# Patient Record
Sex: Male | Born: 1950 | Race: White | Hispanic: No | Marital: Single
Health system: Southern US, Community
[De-identification: ages and names within clinical notes are randomized; demographics above are authoritative.]

## PROBLEM LIST (undated history)

## (undated) ENCOUNTER — Emergency Department (HOSPITAL_BASED_OUTPATIENT_CLINIC_OR_DEPARTMENT_OTHER): Admission: EM | Payer: BC Managed Care – PPO | Source: Home / Self Care

---

## 2003-09-11 ENCOUNTER — Emergency Department (HOSPITAL_COMMUNITY): Admission: EM | Admit: 2003-09-11 | Discharge: 2003-09-11 | Payer: Self-pay | Admitting: *Deleted

## 2003-09-17 ENCOUNTER — Ambulatory Visit (HOSPITAL_COMMUNITY): Admission: RE | Admit: 2003-09-17 | Discharge: 2003-09-17 | Payer: Self-pay | Admitting: *Deleted

## 2003-11-09 ENCOUNTER — Ambulatory Visit (HOSPITAL_COMMUNITY): Admission: RE | Admit: 2003-11-09 | Discharge: 2003-11-09 | Payer: Self-pay | Admitting: Gastroenterology
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
## 2005-11-23 ENCOUNTER — Ambulatory Visit (HOSPITAL_BASED_OUTPATIENT_CLINIC_OR_DEPARTMENT_OTHER): Admission: RE | Admit: 2005-11-23 | Discharge: 2005-11-24 | Payer: Self-pay | Admitting: Orthopedic Surgery

## 2005-12-19 ENCOUNTER — Ambulatory Visit (HOSPITAL_BASED_OUTPATIENT_CLINIC_OR_DEPARTMENT_OTHER): Admission: RE | Admit: 2005-12-19 | Discharge: 2005-12-19 | Payer: Self-pay | Admitting: Orthopedic Surgery

## 2005-12-19 ENCOUNTER — Encounter (INDEPENDENT_AMBULATORY_CARE_PROVIDER_SITE_OTHER): Payer: Self-pay | Admitting: Specialist

## 2007-11-01 ENCOUNTER — Ambulatory Visit (HOSPITAL_BASED_OUTPATIENT_CLINIC_OR_DEPARTMENT_OTHER): Admission: RE | Admit: 2007-11-01 | Discharge: 2007-11-01 | Payer: Self-pay | Admitting: Orthopedic Surgery

## 2007-11-01 ENCOUNTER — Encounter (INDEPENDENT_AMBULATORY_CARE_PROVIDER_SITE_OTHER): Payer: Self-pay | Admitting: Orthopedic Surgery

## 2010-11-18 NOTE — Op Note (Signed)
NAME:  Franklin Hale, Franklin Hale                        ACCOUNT NO.:  [id number]   MEDICAL RECORD NO.:  [id number]                   PATIENT TYPE:  AMB   LOCATION:  ENDO                                 FACILITY:  [facility]   PHYSICIAN:  Petra Kuba, M.D.                 DATE OF BIRTH:  05-Dec-1950   DATE OF PROCEDURE:  11/09/2003  DATE OF DISCHARGE:                                 OPERATIVE REPORT   PROCEDURE:  Colonoscopy.   INDICATIONS:  Screening.  Consent was signed after risks, benefits, methods  and options were thoroughly discussed prior to any premedications given, and  with my nurse in the office.   MEDICATIONS USED:  1. Demerol 70 mg .  2. Versed  8 mg.   DESCRIPTION OF PROCEDURE:  Rectal inspection is pertinent for small external  hemorrhoids.  Digital examination was negative.  Video pediatric adjustable  colonoscope was inserted, fairly easily advanced around the colon to the  cecum.  This did require rolling him onto his back and some abdominal  pressures.  No abnormality was seen on insertion.  The cecum was identified  by the appendiceal orifice and the ileocecal valve.  The scope was inserted  a short way in the terminal ileum, which was normal.  Photo documentation  was obtained.  The scope was slowly withdrawn.   The prep was fairly adequate, after about one liter of washing adequate  visualization was obtained.  On slow withdrawal through the colon, other  than a rare left-sided diverticula, no polyps, tumors, masses were seen.  Anal and rectal pull through in retroflexion did confirm some small  hemorrhoids.   The scope was reinserted a short way up the left side of the colon.  Air was  suctioned and scope removed.  The patient tolerated the procedure well,  there were no obvious immediate complication.   ENDOSCOPIC DIAGNOSES:  1. Tiny internal/external hemorrhoids.  2. Rare left-sided diverticula.  3. Otherwise within normal limits to the cecum and terminal  ileum.   PLAN:  Yearly rectals and guaiacs.  Happy to see patient back p.r.n.,  otherwise repeat screening in 5-10 years, based on screening  recommendations.                                               Petra Kuba, M.D.    MEM/MEDQ  D:  11/09/2003  T:  11/09/2003  Job:  [id number]   cc:   Demetria Pore. Coral Spikes, M.D.  [address]  [address]  [place]  [place] [address]  Fax: [phone]

## 2010-11-18 NOTE — Op Note (Signed)
NAME:  Franklin Hale, Franklin Hale NO.:  [id number]   MEDICAL RECORD NO.:  [id number]          PATIENT TYPE:  AMB   LOCATION:  DSC                          FACILITY:  [facility]   PHYSICIAN:  Katy Fitch. Sypher, M.D. 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  Also, rule out labral degenerative  pathology.   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   OPERATION:  1.  Examination of left shoulder under anesthesia.  2.  Arthroscopic evaluation of left glenohumeral joint with debridement of      degenerative labral fragments and fragments of degenerative      supraspinatus rotator cuff tendon.  3.  Arthroscopic subacromial decompression with bursectomy, coracoacromial      ligament relaxation and acromioplasty.  4.  Open resection of distal clavicle.   OPERATIONS:  Katy Fitch. Sypher, M.D.   ASSISTANT:  Molly Maduro Dasnoit PA-C.   ANESTHESIA:  General endotracheal supplemented by interscalene block.   SUPERVISING ANESTHESIOLOGIST:  Dr. Gypsy Balsam.   INDICATIONS:  [name]  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   He noted soreness in his shoulder after play and after aggressive serving in  overhead shots he would at times feel pain beneath the deltoid muscle.  He  also noted some prominence of the [facility] joint.   At his initial presentation it was apparent from plain x-rays and clinical  examination that he had AC arthropathy and signs of subacromial impingement.   We started a rehabilitation program and advised a period of rest for the  shoulder.   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  Therefore  MRI the shoulder was obtained.   He was noted to have significant degenerative changes at the [facility] joint and  periarticular edema in the distal clavicle and medial acromion.  The contour  of the [facility] joint and capsule was unfavorable.  There was also noted to be a  type 2 acromion morphology and signs of tendinopathy of the rotator cuff.  The anterior labrum appeared to be indistinct.   After lengthy informed consent, we advised Dr. [name] to consider  arthroscopic evaluation of the shoulder anticipating debridement of his  labrum and cuff, careful inspection the rotator cuff  to determine whether  not he is developing a clinically significant rotator cuff degenerative tear  and an anticipated subacromial decompression.  We anticipated performing  distal clavicle resection and I advised him that it was my first choice to  proceed with open resection of the distal clavicle so as to allow repair of  the deltoid and trapezius muscles intraoperatively.   After informed consent by Dr. Gypsy Balsam, an interscalene block was placed in the  holding area and Dr. [name] is brought to the operating room at this time  anticipating the aforementioned procedure.   PROCEDURE:  [name]  [name], MD, is brought to the operating room and  placed in supine position upon the operating table.   Following an interscalene block by Dr.  Gypsy Balsam in the holding area, anesthesia  of the left arm and forequarter was complete.   Dr. [name] was placed in supine position upon the operating table and  general endotracheal anesthesia induced under direct supervision of Dr.  Gypsy Balsam.   Dr. [name] was carefully positioned in the beach-chair position with the  aid of a torso and head-holder designed for shoulder arthroscopy.  Passive  calf compression pumps were applied for deep vein thrombosis prophylaxis.   The entire left shoulder, forequarter and brachium were prepped with  DuraPrep and after waiting 3 minutes to allow the DuraPrep to dry,  impervious arthroscopy drapes were applied.   Examination of the left shoulder under anesthesia revealed elevation 180  degrees, external rotation 95 degrees, internal rotation to 75 degrees  without signs of glenohumeral instability.   The shoulder was then distended with 20 mL of sterile saline with a spinal  needle brought in anteriorly.  The scope was placed through a standard  viewing portal posteriorly and diagnostic arthroscopy revealed intact  hyaline articular cartilage surfaces of the glenoid and humeral head except  for small 5 x 6 mm area of full-thickness cartilage loss of the anterior  humeral head deep to the supraspinatus and rotator interval.  The anterior  labrum was quite degenerative and had a bucket-handle type tear that was  fragmented.  This was documented with a digital camera.  The deep surface of  the supraspinatus likewise had extensive degeneration with fragments of the  tendon hanging within the joint.  I would estimate that the thickness of the  rotator cuff tear was approximately 30% the thickness of the supraspinatus  at its insertion.  The biceps tendon was normal and stable at its origin and  unimpeded through the rotator interval.  There was minor synovitis adjacent to the subscapularis tendon but there was no evidence of a subscapularis  tendon injury.   The infraspinatus and teres minor were intact.   A anterior portal was created under direct vision with a blunt trocar  followed by placement of a 4.5 mm suction shaver.  The fragments of the  labrum were debrided to a smooth margin followed by debridement of the  rotator cuff to a smooth margin.   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   The supraspinatus was debrided through a stable margin and photographic  documentation of the fibrillation was noted.   Unfortunately, due to a light reflex off of the tendon, our digital images  do not well document the degree of fibular degeneration, but I would  estimate that more  than half the thickness of the tendon was loosening its  ground substance.   After completion of the rotator cuff debridement, the joint was thoroughly  lavaged with sterile saline and the arthroscopic equipment removed.  The  scope was placed in subacromial space and a very florid bursitis noted.   After extensive bursectomy, the anatomy the coracoacromial arch was studied.   The [facility] joint was quite prominent with a lip of both acromion and clavicle  causing impingement anteriorly.  The capsule of the [facility] joint was intact but  quite prominent.  The capsule was taken down with the cutting cautery and  the distal clavicle inspected.  There were extensive degenerative changes  and remodeling of the hyaline articular cartilage surface.   The coracoacromial ligament was relaxed with a cutting cautery followed by  use of a suction bur to level the acromion to a type 1 morphology.  The  anterior lip of the acromion was beveled.  The medial osteophyte at the [facility]  joint was removed with the suction bur followed by use of a suction shaver  to remove all capsular debris.  The  cutting cautery was used to release the  [facility] capsule posteriorly, inferiorly and anteriorly.  Hemostasis was achieved  with a bipolar cautery.   The arthroscopic equipment was then removed followed by a 2 cm incision  directly over the distal clavicle.   A 15 blade and small osteotomes were used to elevate the capsule of the [facility]  joint subperiosteally exposing the distal 15 mm of clavicle.   An oscillating saw was used to resect the distal clavicle followed by use of  a rongeur to debride remnants of the meniscus and the capsular tissues that  could hang inferiorly.   After hemostasis was achieved, I digitally palpated the acromioplasty and  found it to be quite smooth and appropriate in morphology.   The dead space created by distal clavicle resection was then repaired by  three figure-of-eight sutures of #2 FiberWire with knots inverted and buried  deep to the muscle fascia.  The wound was then repaired with subdermal sutures of 2-0 Vicryl and  intradermal 3-0 Prolene with Steri-Strips.   The scope was replaced in the subacromial space and all clot from the distal  clavicle resection aspirated with the suction shaver.  A final debridement  of the anterior margin of the acromion was accomplished and photographic  documentation of the decompression also completed.   The arthroscopic equipment was removed followed by closure of the portals  with mattress suture of 3-0 Prolene.   The wounds were dressed with Xeroflo, sterile gauze and Hypafix.  There were  no apparent complications.   Dr. [name] awakened from general anesthesia and transferred to the  recovery room with stable vital signs.   We will probably keep Dr. [name] in the recovery care for observation for  23 hours postoperatively so as to provide IV antibiotic prophylaxis and  appropriate analgesia.  There were no apparent complications.      Katy Fitch Sypher, M.D.  Electronically Signed     RVS/MEDQ  D:   11/23/2005  T:  11/23/2005  Job:  [id number]   cc:   Demetria Pore. Coral Spikes, M.D.  Fax: [phone]

## 2010-11-18 NOTE — Op Note (Signed)
NAME:  Franklin Hale, Franklin Hale NO.:  [id number]   MEDICAL RECORD NO.:  [id number]          PATIENT TYPE:  AMB   LOCATION:  DSC                          FACILITY:  [facility]   PHYSICIAN:  Katy Fitch. Sypher, M.D. DATE OF BIRTH:  06-May-1951   DATE OF PROCEDURE:  12/19/2005  DATE OF DISCHARGE:                                 OPERATIVE REPORT   PREOPERATIVE DIAGNOSIS:  Enlarging mass dorsal aspect right forearm  consistent with probable subcutaneous lipoma.   POSTOPERATIVE DIAGNOSIS:  Probable subcutaneous lipoma measuring 9 x 10 mm.   OPERATION:  Excisional biopsy of mass dorsal aspect right forearm.   OPERATIONS:  Katy Fitch. Sypher, M.D.   ASSISTANT:  Marveen Reeks. Dasnoit, P.A.-C.   ANESTHESIA:  2% lidocaine field block of dorsal right forearm.   ANESTHETIST:  Dr. Teressa Senter, this was performed in the minor operating room at  the [facility].   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  We elected to initially  address a left shoulder predicament with arthroscopy and distal clavicle  resection.  Dr. [name] is now recovering from his left shoulder procedure  and requested that we excise a mass on the dorsal aspect of his right  forearm that he noted approximately six months ago that has been gradually  enlarging in size.  The indication for surgery is diagnosis and hopeful  resolution of this predicament.  After informed consent, he is brought to  the minor operating room at this time anticipating an excisional biopsy  under straight local anesthesia.   PROCEDURE:  Sylvester Harder, M.D., is brought to the operating room and  placed in the supine position on the operating table.  Following side  identification per protocol, the arm was prepped with Betadine and the field  surrounding the mass infiltrated with 2%  lidocaine down to the level of the  extensor musculature and fascia.  The arm was then prepped with Betadine  soap solution and sterilely draped with sterile towels.  Dr. [name]  exsanguinated his hand by fist compression and we directly compressed his  forearm.  An arterial tourniquet on the proximal brachium was inflated to  220 mmHg.   After testing for adequate anesthesia, an 18 mm incision was fashioned  directly over the mass.  The subcutaneous tissues were carefully divided  taking care to identify the lipoma and distinguishing it from the normal  subcutaneous fat by a more profused reddish yellow appearance than the  surrounding subcutaneous fat.  The mass was circumferentially dissected and  found to have a small feeding vessel.  This was transected approximately 5  mm from the mass.  A rongeur was used to contour the surrounding fat to  prevent a dimple.  The wound was then checked for bleeding points and  repaired with intradermal 3-0 Prolene and Steri-Strips.  There were no  apparent complications.  The wound was dressed with sterile gauze and a  Tegaderm transparent dressing.  Dr. [name] will change the Tegaderm dressing after 48 hours.  He will  return to see me in the office for follow-up in one week.  We will contact  him with the results of his biopsy at the time of his office visit or sooner  p.r.n. any surprises.      Katy Fitch Sypher, M.D.  Electronically Signed     RVS/MEDQ  D:  12/19/2005  T:  12/19/2005  Job:  [id number]

## 2012-02-22 ENCOUNTER — Other Ambulatory Visit: Payer: Self-pay | Admitting: Geriatric Medicine

## 2012-02-22 DIAGNOSIS — Z0000 Encounter for general adult medical examination without abnormal findings: Secondary | ICD-10-CM

## 2012-03-05 ENCOUNTER — Ambulatory Visit
Admission: RE | Admit: 2012-03-05 | Discharge: 2012-03-05 | Disposition: A | Discharge status: Home / Self Care | Payer: BC Managed Care – PPO | Source: Ambulatory Visit | Attending: Geriatric Medicine | Admitting: Geriatric Medicine

## 2012-03-05 DIAGNOSIS — Z0000 Encounter for general adult medical examination without abnormal findings: Secondary | ICD-10-CM

## 2012-10-31 ENCOUNTER — Other Ambulatory Visit: Payer: Self-pay | Admitting: Gastroenterology

## 2013-03-17 ENCOUNTER — Other Ambulatory Visit: Payer: Self-pay | Admitting: Geriatric Medicine

## 2013-03-17 DIAGNOSIS — E041 Nontoxic single thyroid nodule: Secondary | ICD-10-CM

## 2013-04-16 ENCOUNTER — Ambulatory Visit
Admission: RE | Admit: 2013-04-16 | Discharge: 2013-04-16 | Disposition: A | Discharge status: Home / Self Care | Payer: BC Managed Care – PPO | Source: Ambulatory Visit | Attending: Geriatric Medicine | Admitting: Geriatric Medicine

## 2013-04-16 DIAGNOSIS — E041 Nontoxic single thyroid nodule: Secondary | ICD-10-CM

## 2014-03-18 ENCOUNTER — Other Ambulatory Visit: Payer: Self-pay | Admitting: Geriatric Medicine

## 2014-03-18 DIAGNOSIS — E041 Nontoxic single thyroid nodule: Secondary | ICD-10-CM

## 2014-03-30 ENCOUNTER — Other Ambulatory Visit: Payer: BC Managed Care – PPO

## 2014-04-15 ENCOUNTER — Ambulatory Visit
Admission: RE | Admit: 2014-04-15 | Discharge: 2014-04-15 | Disposition: A | Discharge status: Home / Self Care | Payer: BC Managed Care – PPO | Source: Ambulatory Visit | Attending: Geriatric Medicine | Admitting: Geriatric Medicine

## 2014-04-15 DIAGNOSIS — E041 Nontoxic single thyroid nodule: Secondary | ICD-10-CM

## 2015-04-02 ENCOUNTER — Other Ambulatory Visit: Payer: Self-pay | Admitting: Geriatric Medicine

## 2015-04-02 DIAGNOSIS — E041 Nontoxic single thyroid nodule: Secondary | ICD-10-CM

## 2015-04-12 ENCOUNTER — Other Ambulatory Visit: Payer: Self-pay

## 2015-04-13 ENCOUNTER — Ambulatory Visit
Admission: RE | Admit: 2015-04-13 | Discharge: 2015-04-13 | Disposition: A | Discharge status: Home / Self Care | Payer: BLUE CROSS/BLUE SHIELD | Source: Ambulatory Visit | Attending: Geriatric Medicine | Admitting: Geriatric Medicine

## 2015-04-13 DIAGNOSIS — E041 Nontoxic single thyroid nodule: Secondary | ICD-10-CM

## 2016-05-18 ENCOUNTER — Other Ambulatory Visit: Payer: Self-pay | Admitting: Geriatric Medicine

## 2016-05-18 DIAGNOSIS — E041 Nontoxic single thyroid nodule: Secondary | ICD-10-CM

## 2016-06-12 ENCOUNTER — Other Ambulatory Visit: Payer: BLUE CROSS/BLUE SHIELD

## 2016-06-12 ENCOUNTER — Ambulatory Visit
Admission: RE | Admit: 2016-06-12 | Discharge: 2016-06-12 | Disposition: A | Discharge status: Home / Self Care | Payer: BLUE CROSS/BLUE SHIELD | Source: Ambulatory Visit | Attending: Geriatric Medicine | Admitting: Geriatric Medicine

## 2016-06-12 DIAGNOSIS — E041 Nontoxic single thyroid nodule: Secondary | ICD-10-CM

## 2018-02-25 ENCOUNTER — Other Ambulatory Visit: Payer: Self-pay | Admitting: Geriatric Medicine

## 2018-02-25 DIAGNOSIS — E041 Nontoxic single thyroid nodule: Secondary | ICD-10-CM

## 2018-04-19 ENCOUNTER — Ambulatory Visit
Admission: RE | Admit: 2018-04-19 | Discharge: 2018-04-19 | Disposition: A | Discharge status: Home / Self Care | Payer: PRIVATE HEALTH INSURANCE | Source: Ambulatory Visit | Attending: Geriatric Medicine | Admitting: Geriatric Medicine

## 2018-04-19 DIAGNOSIS — E041 Nontoxic single thyroid nodule: Secondary | ICD-10-CM

## 2020-04-01 ENCOUNTER — Other Ambulatory Visit: Payer: Self-pay | Admitting: Geriatric Medicine

## 2020-04-01 DIAGNOSIS — E041 Nontoxic single thyroid nodule: Secondary | ICD-10-CM

## 2020-04-14 ENCOUNTER — Ambulatory Visit
Admission: RE | Admit: 2020-04-14 | Discharge: 2020-04-14 | Disposition: A | Discharge status: Home / Self Care | Payer: PRIVATE HEALTH INSURANCE | Source: Ambulatory Visit | Attending: Geriatric Medicine | Admitting: Geriatric Medicine

## 2020-04-14 DIAGNOSIS — E041 Nontoxic single thyroid nodule: Secondary | ICD-10-CM

## 2021-06-06 ENCOUNTER — Other Ambulatory Visit: Payer: Self-pay | Admitting: Geriatric Medicine

## 2021-06-06 DIAGNOSIS — E041 Nontoxic single thyroid nodule: Secondary | ICD-10-CM

## 2021-06-30 ENCOUNTER — Other Ambulatory Visit: Payer: PRIVATE HEALTH INSURANCE

## 2021-08-30 ENCOUNTER — Ambulatory Visit
Admission: RE | Admit: 2021-08-30 | Discharge: 2021-08-30 | Disposition: A | Discharge status: Home / Self Care | Payer: No Typology Code available for payment source | Source: Ambulatory Visit | Attending: Geriatric Medicine | Admitting: Geriatric Medicine

## 2021-08-30 DIAGNOSIS — E041 Nontoxic single thyroid nodule: Secondary | ICD-10-CM
